# Patient Record
Sex: Female | Born: 2000 | Marital: Single | Smoking: Never smoker
Health system: Southern US, Community
[De-identification: ages and names within clinical notes are randomized; demographics above are authoritative.]

## PROBLEM LIST (undated history)

## (undated) HISTORY — PX: EYE SURGERY: SHX253

---
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
## 2021-02-26 ENCOUNTER — Emergency Department
Admission: EM | Admit: 2021-02-26 | Discharge: 2021-02-26 | Disposition: A | Discharge status: Home / Self Care | Payer: BC Managed Care – PPO | Attending: Emergency Medicine | Admitting: Emergency Medicine

## 2021-02-26 ENCOUNTER — Encounter: Payer: Self-pay | Admitting: Emergency Medicine

## 2021-02-26 ENCOUNTER — Emergency Department: Payer: BC Managed Care – PPO

## 2021-02-26 ENCOUNTER — Other Ambulatory Visit: Payer: Self-pay

## 2021-02-26 DIAGNOSIS — N12 Tubulo-interstitial nephritis, not specified as acute or chronic: Secondary | ICD-10-CM | POA: Insufficient documentation

## 2021-02-26 DIAGNOSIS — R109 Unspecified abdominal pain: Secondary | ICD-10-CM | POA: Diagnosis present

## 2021-02-26 LAB — LIPASE, BLOOD: Lipase: 28 U/L (ref 11–51)

## 2021-02-26 LAB — CBC
HCT: 36.4 % (ref 36.0–46.0)
Hemoglobin: 11.5 g/dL — ABNORMAL LOW (ref 12.0–15.0)
MCH: 22.8 pg — ABNORMAL LOW (ref 26.0–34.0)
MCHC: 31.6 g/dL (ref 30.0–36.0)
MCV: 72.2 fL — ABNORMAL LOW (ref 80.0–100.0)
Platelets: 289 10*3/uL (ref 150–400)
RBC: 5.04 MIL/uL (ref 3.87–5.11)
RDW: 18.7 % — ABNORMAL HIGH (ref 11.5–15.5)
WBC: 8.3 10*3/uL (ref 4.0–10.5)
nRBC: 0 % (ref 0.0–0.2)

## 2021-02-26 LAB — URINALYSIS, ROUTINE W REFLEX MICROSCOPIC
Bilirubin Urine: NEGATIVE
Glucose, UA: NEGATIVE mg/dL
Ketones, ur: 5 mg/dL — AB
Nitrite: NEGATIVE
Protein, ur: 30 mg/dL — AB
RBC / HPF: >50 RBC/hpf — ABNORMAL HIGH (ref 0–5)
Specific Gravity, Urine: 1.02 (ref 1.005–1.030)
WBC, UA: >50 WBC/hpf — ABNORMAL HIGH (ref 0–5)
pH: 5 (ref 5.0–8.0)

## 2021-02-26 LAB — COMPREHENSIVE METABOLIC PANEL
ALT: 12 U/L (ref 0–44)
AST: 17 U/L (ref 15–41)
Albumin: 4.1 g/dL (ref 3.5–5.0)
Alkaline Phosphatase: 48 U/L (ref 38–126)
Anion gap: 7 (ref 5–15)
BUN: 13 mg/dL (ref 6–20)
CO2: 24 mmol/L (ref 22–32)
Calcium: 9.3 mg/dL (ref 8.9–10.3)
Chloride: 106 mmol/L (ref 98–111)
Creatinine, Ser: 0.7 mg/dL (ref 0.44–1.00)
GFR, Estimated: >60 mL/min (ref >60)
Glucose, Bld: 123 mg/dL — ABNORMAL HIGH (ref 70–99)
Potassium: 3.6 mmol/L (ref 3.5–5.1)
Sodium: 137 mmol/L (ref 135–145)
Total Bilirubin: 0.7 mg/dL (ref 0.3–1.2)
Total Protein: 8 g/dL (ref 6.5–8.1)

## 2021-02-26 LAB — POC URINE PREG, ED: Preg Test, Ur: NEGATIVE

## 2021-02-26 MED ORDER — SODIUM CHLORIDE 0.9 % IV BOLUS
1000.0000 mL | Freq: Once | INTRAVENOUS | Status: AC
  Administered 2021-02-26: 1000 mL via INTRAVENOUS

## 2021-02-26 MED ORDER — IBUPROFEN 600 MG PO TABS: 600.0000 mg | ORAL_TABLET | Freq: Four times a day (QID) | ORAL | 0 refills | Status: AC | PRN

## 2021-02-26 MED ORDER — ONDANSETRON HCL 4 MG/2ML IJ SOLN
4.0000 mg | Freq: Once | INTRAMUSCULAR | Status: AC
  Administered 2021-02-26: 4 mg via INTRAVENOUS
  Filled 2021-02-26: qty 2

## 2021-02-26 MED ORDER — KETOROLAC TROMETHAMINE 30 MG/ML IJ SOLN
15.0000 mg | Freq: Once | INTRAMUSCULAR | Status: AC
  Administered 2021-02-26: 15 mg via INTRAVENOUS
  Filled 2021-02-26: qty 1

## 2021-02-26 MED ORDER — SODIUM CHLORIDE 0.9 % IV SOLN
2.0000 g | Freq: Once | INTRAVENOUS | Status: AC
  Administered 2021-02-26: 2 g via INTRAVENOUS
  Filled 2021-02-26: qty 20

## 2021-02-26 MED ORDER — MORPHINE SULFATE (PF) 4 MG/ML IV SOLN
4.0000 mg | Freq: Once | INTRAVENOUS | Status: AC
  Administered 2021-02-26: 4 mg via INTRAVENOUS
  Filled 2021-02-26: qty 1

## 2021-02-26 MED ORDER — ONDANSETRON 4 MG PO TBDP: 4.0000 mg | ORAL_TABLET | Freq: Three times a day (TID) | ORAL | 0 refills | Status: AC | PRN

## 2021-02-26 MED ORDER — CEFDINIR 300 MG PO CAPS: 300.0000 mg | ORAL_CAPSULE | Freq: Two times a day (BID) | ORAL | 0 refills | Status: AC

## 2021-02-26 NOTE — ED Triage Notes (Signed)
Pt to ED via POV with co left side pain for a week. She states that when it first started that she was having vomiting with it. Since then she has not had vomiting. She has continued to have left side pain, no radiation. Denies any GU symptoms or blood in urine.

## 2021-02-26 NOTE — ED Provider Notes (Signed)
[facility] Emergency Department Provider Note  ____________________________________________   Event Date/Time   First MD Initiated Contact with Patient 02/26/21 1741     (approximate)  I have reviewed the triage vital signs and the nursing notes.   HISTORY  Chief Complaint Abdominal Pain    HPI Sydney Ortega is a 20 y.o. female here with left flank pain.  The patient states that for the last 4 to 5 days, she has had progressive worsening aching, throbbing, left flank pain.  The pain began fairly gradually but got fairly severe quickly.  She has had persistent, daily pain since then.  It is slightly improved with moving around but gets worse with lying flat or on her left side.  She states that on the onset of pain she did have some urinary frequency but no overt dysuria.  Denies any hematuria.  Her last period was 2 weeks ago and was normal.  No history of ovarian cyst.  Denies any vaginal discharge or dyspareunia, though she is sexually active.  She has a family history of kidney stones.  Denies personal history of kidney stones.  No personal history of recurrent UTIs.      History reviewed. No pertinent past medical history.  There are no problems to display for this patient.   Past Surgical History:  Procedure Laterality Date   EYE SURGERY      Prior to Admission medications   Medication Sig Start Date End Date Taking? Authorizing Provider  cefdinir (OMNICEF) 300 MG capsule Take 1 capsule (300 mg total) by mouth 2 (two) times daily for 10 days. 02/26/21 03/08/21 Yes Shaune Pollack, MD  ibuprofen (ADVIL) 600 MG tablet Take 1 tablet (600 mg total) by mouth every 6 (six) hours as needed for moderate pain. 02/26/21  Yes Shaune Pollack, MD  ondansetron (ZOFRAN ODT) 4 MG disintegrating tablet Take 1 tablet (4 mg total) by mouth every 8 (eight) hours as needed for nausea or vomiting. 02/26/21  Yes Shaune Pollack, MD  promethazine (PHENERGAN)  12.5 MG tablet Take 1 tablet (12.5 mg total) by mouth every 6 (six) hours as needed for up to 7 days for nausea or vomiting. 08/02/20 08/09/20  Shirlee Latch, PA-C    Allergies Patient has no known allergies.  No family history on file.  Social History Social History   Tobacco Use   Smoking status: Never   Smokeless tobacco: Never  Vaping Use   Vaping Use: Never used  Substance Use Topics   Alcohol use: Not Currently   Drug use: Not Currently    Review of Systems  Review of Systems  Constitutional:  Positive for chills and fatigue. Negative for fever.  HENT:  Negative for sore throat.   Respiratory:  Negative for shortness of breath.   Cardiovascular:  Negative for chest pain.  Gastrointestinal:  Negative for abdominal pain.  Genitourinary:  Positive for flank pain and frequency.  Musculoskeletal:  Negative for neck pain.  Skin:  Negative for rash and wound.  Allergic/Immunologic: Negative for immunocompromised state.  Neurological:  Negative for weakness and numbness.  Hematological:  Does not bruise/bleed easily.  All other systems reviewed and are negative.   ____________________________________________  PHYSICAL EXAM:      VITAL SIGNS: ED Triage Vitals  Enc Vitals Group     BP 02/26/21 1422 102/75     Pulse Rate 02/26/21 1422 87     Resp 02/26/21 1422 20     Temp 02/26/21 1422 98.1  F (36.7 C)     Temp Source 02/26/21 1422 Oral     SpO2 02/26/21 1422 100 %     Weight 02/26/21 1422 181 lb (82.1 kg)     Height 02/26/21 1422 5\' 6"  (1.676 m)     Head Circumference --      Peak Flow --      Pain Score 02/26/21 1428 7     Pain Loc --      Pain Edu? --      Excl. in Lane? --      Physical Exam Vitals and nursing note reviewed.  Constitutional:      General: She is not in acute distress.    Appearance: She is well-developed.  HENT:     Head: Normocephalic and atraumatic.  Eyes:     Conjunctiva/sclera: Conjunctivae normal.  Cardiovascular:     Rate and  Rhythm: Normal rate and regular rhythm.     Heart sounds: Normal heart sounds. No murmur heard.   No friction rub.  Pulmonary:     Effort: Pulmonary effort is normal. No respiratory distress.     Breath sounds: Normal breath sounds. No wheezing or rales.  Abdominal:     General: There is no distension.     Palpations: Abdomen is soft.     Tenderness: There is abdominal tenderness in the left upper quadrant. There is left CVA tenderness. There is no guarding or rebound.  Musculoskeletal:     Cervical back: Neck supple.  Skin:    General: Skin is warm.     Capillary Refill: Capillary refill takes less than 2 seconds.  Neurological:     Mental Status: She is alert and oriented to person, place, and time.     Motor: No abnormal muscle tone.      ____________________________________________   LABS (all labs ordered are listed, but only abnormal results are displayed)  Labs Reviewed  COMPREHENSIVE METABOLIC PANEL - Abnormal; Notable for the following components:      Result Value   Glucose, Bld 123 (*)    All other components within normal limits  CBC - Abnormal; Notable for the following components:   Hemoglobin 11.5 (*)    MCV 72.2 (*)    MCH 22.8 (*)    RDW 18.7 (*)    All other components within normal limits  URINALYSIS, ROUTINE W REFLEX MICROSCOPIC - Abnormal; Notable for the following components:   Color, Urine YELLOW (*)    APPearance CLOUDY (*)    Hgb urine dipstick MODERATE (*)    Ketones, ur 5 (*)    Protein, ur 30 (*)    Leukocytes,Ua LARGE (*)    RBC / HPF >50 (*)    WBC, UA >50 (*)    Bacteria, UA FEW (*)    All other components within normal limits  CULTURE, BLOOD (SINGLE)  URINE CULTURE  LIPASE, BLOOD  POC URINE PREG, ED    ____________________________________________  EKG:  ________________________________________  RADIOLOGY All imaging, including plain films, CT scans, and ultrasounds, independently reviewed by me, and interpretations confirmed  via formal radiology reads.  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 COMPARISON:  None FINDINGS: Lower chest: Lung bases clear Hepatobiliary: Gallbladder and liver normal appearance Pancreas: Normal appearance Spleen: Normal appearance Adrenals/Urinary Tract: Adrenal glands normal appearance. 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 Kidneys, ureters, and bladder otherwise normal appearance. No urinary tract calcification or dilatation. Stomach/Bowel: Stomach and bowel loops normal appearance. Normal appendix. Vascular/Lymphatic: Aorta normal caliber. Scattered normal sized mesenteric lymph nodes Reproductive: Uterus and RIGHT adnexa unremarkable. Asymmetrically larger LEFT ovary than RIGHT. LEFT ovarian cyst 3.5 x 2.2 cm image 65. Other: No free air or free fluid. No hernia or inflammatory process. 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 No urinary tract calcification or dilatation. Simple appearing LEFT ovarian cyst 3.5 x 2.2 cm; no follow-up imaging recommended. Electronically Signed   By: Lavonia Dana M.D.   On: 02/26/2021 19:00    ____________________________________________  PROCEDURES   Procedure(s) performed (including Critical Care):  Procedures  ____________________________________________  INITIAL IMPRESSION / MDM / [place] / ED COURSE  As  part of my medical decision making, I reviewed the following data within the [place] notes reviewed and incorporated, Old chart reviewed, Notes from prior ED visits, and [place] Controlled Substance Database       *Sydney Ortega was evaluated in Emergency Department on 02/26/2021 for the symptoms described in the history of present illness. She was evaluated in the context of the global COVID-19 pandemic, which necessitated consideration that the patient might be at risk for infection with the SARS-CoV-2 virus that causes COVID-19. Institutional protocols and algorithms that pertain to the evaluation of patients at risk for COVID-19 are in a state of rapid change based on information released by regulatory bodies including the CDC and federal and state organizations. These policies and algorithms were followed during the patient's care in the ED.  Some ED evaluations and interventions may be delayed as a result of limited staffing during the pandemic.*     Medical Decision Making: 20 year old female here with left flank pain for the last week.  On exam, the patient is overall well-appearing and nontoxic, in no acute distress.  Suspect pyelonephritis.  Patient urine shows significant hematuria, pyuria, and bacteria with mild dehydration.  Otherwise, she is nontoxic with no evidence of sepsis.  Vital signs are stable.  White count without significant leukocytosis.  CMP unremarkable with normal renal function and LFTs.  Urine pregnancy negative.  Given that the pain started on the left flank, CT obtained to evaluate for stone and shows no evidence of stone or obstruction.  Patient has an incidental small left ovarian cyst, which does not correlate with her area of pain.  Moreover, pain began gradually and is lasted 1 week, do not suspect torsion.  Denies any vaginal discharge or symptoms of PID.  Patient was given a dose of Rocephin, fluids, and symptomatic management here  with improvement and near resolution of pain.  Will treat as an outpatient with cefdinir.  Urine cultures been sent.  Return precautions given in detail.  ____________________________________________  FINAL CLINICAL IMPRESSION(S) / ED DIAGNOSES  Final diagnoses:  Pyelonephritis     MEDICATIONS GIVEN DURING THIS VISIT:  Medications  cefTRIAXone (ROCEPHIN) 2 g in sodium chloride 0.9 % 100 mL IVPB (2 g Intravenous New Bag/Given 02/26/21 1917)  sodium chloride 0.9 % bolus 1,000 mL (1,000 mLs Intravenous  New Bag/Given 02/26/21 1915)  ketorolac (TORADOL) 30 MG/ML injection 15 mg (15 mg Intravenous Given 02/26/21 1934)  morphine 4 MG/ML injection 4 mg (4 mg Intravenous Given 02/26/21 1933)  ondansetron (ZOFRAN) injection 4 mg (4 mg Intravenous Given 02/26/21 1932)     ED Discharge Orders          Ordered    ondansetron (ZOFRAN ODT) 4 MG disintegrating tablet  Every 8 hours PRN        02/26/21 2034    cefdinir (OMNICEF) 300 MG capsule  2 times daily        02/26/21 2034    ibuprofen (ADVIL) 600 MG tablet  Every 6 hours PRN        02/26/21 2034             Note:  This document was prepared using Dragon voice recognition software and may include unintentional dictation errors.   Duffy Bruce, MD 02/26/21 2039

## 2021-02-26 NOTE — Discharge Instructions (Addendum)
Take the full course of the antibiotic as prescribed  Take the ibuprofen 600 mg every 6 hours for pain  Take the zofran for nausea as needed

## 2021-02-28 LAB — URINE CULTURE: Culture: >=100000 — AB

## 2021-03-03 LAB — CULTURE, BLOOD (SINGLE)
Culture: NO GROWTH
Special Requests: ADEQUATE

## 2021-03-05 ENCOUNTER — Emergency Department: Payer: BC Managed Care – PPO

## 2021-03-05 ENCOUNTER — Other Ambulatory Visit: Payer: Self-pay

## 2021-03-05 ENCOUNTER — Emergency Department
Admission: EM | Admit: 2021-03-05 | Discharge: 2021-03-05 | Disposition: A | Discharge status: Home / Self Care | Payer: BC Managed Care – PPO | Attending: Emergency Medicine | Admitting: Emergency Medicine

## 2021-03-05 DIAGNOSIS — S0990XA Unspecified injury of head, initial encounter: Secondary | ICD-10-CM | POA: Diagnosis present

## 2021-03-05 DIAGNOSIS — R1031 Right lower quadrant pain: Secondary | ICD-10-CM | POA: Diagnosis not present

## 2021-03-05 DIAGNOSIS — H9319 Tinnitus, unspecified ear: Secondary | ICD-10-CM | POA: Insufficient documentation

## 2021-03-05 DIAGNOSIS — R1032 Left lower quadrant pain: Secondary | ICD-10-CM | POA: Insufficient documentation

## 2021-03-05 DIAGNOSIS — H538 Other visual disturbances: Secondary | ICD-10-CM | POA: Diagnosis not present

## 2021-03-05 DIAGNOSIS — Y92410 Unspecified street and highway as the place of occurrence of the external cause: Secondary | ICD-10-CM | POA: Insufficient documentation

## 2021-03-05 LAB — CBC WITH DIFFERENTIAL/PLATELET
Abs Immature Granulocytes: 0.03 10*3/uL (ref 0.00–0.07)
Basophils Absolute: 0 10*3/uL (ref 0.0–0.1)
Basophils Relative: 0 %
Eosinophils Absolute: 0.3 10*3/uL (ref 0.0–0.5)
Eosinophils Relative: 3 %
HCT: 40.4 % (ref 36.0–46.0)
Hemoglobin: 12.1 g/dL (ref 12.0–15.0)
Immature Granulocytes: 0 %
Lymphocytes Relative: 44 %
Lymphs Abs: 4.3 10*3/uL — ABNORMAL HIGH (ref 0.7–4.0)
MCH: 21.4 pg — ABNORMAL LOW (ref 26.0–34.0)
MCHC: 30 g/dL (ref 30.0–36.0)
MCV: 71.5 fL — ABNORMAL LOW (ref 80.0–100.0)
Monocytes Absolute: 0.5 10*3/uL (ref 0.1–1.0)
Monocytes Relative: 5 %
Neutro Abs: 4.7 10*3/uL (ref 1.7–7.7)
Neutrophils Relative %: 48 %
Platelets: 378 10*3/uL (ref 150–400)
RBC: 5.65 MIL/uL — ABNORMAL HIGH (ref 3.87–5.11)
RDW: 19 % — ABNORMAL HIGH (ref 11.5–15.5)
WBC: 9.9 10*3/uL (ref 4.0–10.5)
nRBC: 0 % (ref 0.0–0.2)

## 2021-03-05 LAB — BASIC METABOLIC PANEL
Anion gap: 10 (ref 5–15)
BUN: 14 mg/dL (ref 6–20)
CO2: 24 mmol/L (ref 22–32)
Calcium: 9.4 mg/dL (ref 8.9–10.3)
Chloride: 105 mmol/L (ref 98–111)
Creatinine, Ser: 0.67 mg/dL (ref 0.44–1.00)
GFR, Estimated: >60 mL/min (ref >60)
Glucose, Bld: 85 mg/dL (ref 70–99)
Potassium: 3.5 mmol/L (ref 3.5–5.1)
Sodium: 139 mmol/L (ref 135–145)

## 2021-03-05 LAB — PROTIME-INR
INR: 1 (ref 0.8–1.2)
Prothrombin Time: 13.5 seconds (ref 11.4–15.2)

## 2021-03-05 LAB — POC URINE PREG, ED: Preg Test, Ur: NEGATIVE

## 2021-03-05 LAB — HCG, QUANTITATIVE, PREGNANCY: hCG, Beta Chain, Quant, S: <1 m[IU]/mL (ref <5)
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
## 2021-03-05 MED ORDER — MELOXICAM 15 MG PO TABS: 15.0000 mg | ORAL_TABLET | Freq: Every day | ORAL | 0 refills | Status: AC

## 2021-03-05 MED ORDER — SODIUM CHLORIDE 0.9 % IV BOLUS
500.0000 mL | Freq: Once | INTRAVENOUS | Status: AC
  Administered 2021-03-05: 500 mL via INTRAVENOUS

## 2021-03-05 MED ORDER — METHOCARBAMOL 500 MG PO TABS: 500.0000 mg | ORAL_TABLET | Freq: Four times a day (QID) | ORAL | 0 refills | Status: AC

## 2021-03-05 MED ORDER — IOHEXOL 300 MG/ML  SOLN
100.0000 mL | Freq: Once | INTRAMUSCULAR | Status: AC | PRN
  Administered 2021-03-05: 100 mL via INTRAVENOUS
  Filled 2021-03-05: qty 100

## 2021-03-05 NOTE — ED Notes (Signed)
E-signature pad unavailable - Pt verbalized understanding of D/C information - no additional concerns at this time.  
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
## 2021-03-12 ENCOUNTER — Ambulatory Visit: Payer: BC Managed Care – PPO | Admitting: Family Medicine

## 2021-04-08 ENCOUNTER — Ambulatory Visit: Payer: BC Managed Care – PPO | Admitting: Family Medicine

## 2021-05-07 ENCOUNTER — Ambulatory Visit: Payer: BC Managed Care – PPO | Admitting: Family Medicine
